# Patient Record
Sex: Female | Born: 1940 | Race: White | Hispanic: No
Health system: Southern US, Community
[De-identification: ages and names within clinical notes are randomized; demographics above are authoritative.]

---

## 2016-04-16 ENCOUNTER — Other Ambulatory Visit: Payer: Self-pay | Admitting: Nurse Practitioner

## 2016-04-16 DIAGNOSIS — M533 Sacrococcygeal disorders, not elsewhere classified: Secondary | ICD-10-CM

## 2016-04-16 DIAGNOSIS — M47816 Spondylosis without myelopathy or radiculopathy, lumbar region: Secondary | ICD-10-CM

## 2016-04-25 ENCOUNTER — Ambulatory Visit
Admission: RE | Admit: 2016-04-25 | Discharge: 2016-04-25 | Disposition: A | Discharge status: Home / Self Care | Payer: Medicare Other | Source: Ambulatory Visit | Attending: Nurse Practitioner | Admitting: Nurse Practitioner

## 2016-04-25 DIAGNOSIS — M47816 Spondylosis without myelopathy or radiculopathy, lumbar region: Secondary | ICD-10-CM

## 2016-04-25 DIAGNOSIS — M533 Sacrococcygeal disorders, not elsewhere classified: Secondary | ICD-10-CM

## 2016-04-25 MED ORDER — METHYLPREDNISOLONE ACETATE 40 MG/ML INJ SUSP (RADIOLOG
120.0000 mg | Freq: Once | INTRAMUSCULAR | Status: AC
  Administered 2016-04-25: 120 mg via EPIDURAL

## 2016-04-25 MED ORDER — IOPAMIDOL (ISOVUE-M 200) INJECTION 41%
1.0000 mL | Freq: Once | INTRAMUSCULAR | Status: AC
  Administered 2016-04-25: 1 mL via EPIDURAL

## 2016-04-25 NOTE — Discharge Instructions (Signed)

## 2016-05-17 ENCOUNTER — Other Ambulatory Visit: Payer: Self-pay | Admitting: Nurse Practitioner

## 2016-05-17 DIAGNOSIS — M461 Sacroiliitis, not elsewhere classified: Secondary | ICD-10-CM

## 2016-05-29 ENCOUNTER — Ambulatory Visit
Admission: RE | Admit: 2016-05-29 | Discharge: 2016-05-29 | Disposition: A | Discharge status: Home / Self Care | Payer: Medicare Other | Source: Ambulatory Visit | Attending: Nurse Practitioner | Admitting: Nurse Practitioner

## 2016-05-29 DIAGNOSIS — M461 Sacroiliitis, not elsewhere classified: Secondary | ICD-10-CM

## 2016-05-29 MED ORDER — IOPAMIDOL (ISOVUE-M 200) INJECTION 41%
1.0000 mL | Freq: Once | INTRAMUSCULAR | Status: AC
  Administered 2016-05-29: 1 mL via INTRA_ARTICULAR

## 2016-05-29 MED ORDER — METHYLPREDNISOLONE ACETATE 40 MG/ML INJ SUSP (RADIOLOG
120.0000 mg | Freq: Once | INTRAMUSCULAR | Status: AC
  Administered 2016-05-29: 120 mg via INTRA_ARTICULAR

## 2016-05-29 NOTE — Discharge Instructions (Signed)

## 2016-06-25 ENCOUNTER — Other Ambulatory Visit: Payer: Self-pay | Admitting: Neurosurgery

## 2016-06-25 DIAGNOSIS — M47816 Spondylosis without myelopathy or radiculopathy, lumbar region: Secondary | ICD-10-CM

## 2016-06-27 ENCOUNTER — Ambulatory Visit
Admission: RE | Admit: 2016-06-27 | Discharge: 2016-06-27 | Disposition: A | Discharge status: Home / Self Care | Payer: Medicare Other | Source: Ambulatory Visit | Attending: Neurosurgery | Admitting: Neurosurgery

## 2016-06-27 DIAGNOSIS — M47816 Spondylosis without myelopathy or radiculopathy, lumbar region: Secondary | ICD-10-CM

## 2016-06-27 MED ORDER — IOPAMIDOL (ISOVUE-M 200) INJECTION 41%
1.0000 mL | Freq: Once | INTRAMUSCULAR | Status: AC
  Administered 2016-06-27: 1 mL via INTRA_ARTICULAR

## 2016-06-27 MED ORDER — METHYLPREDNISOLONE ACETATE 40 MG/ML INJ SUSP (RADIOLOG
120.0000 mg | Freq: Once | INTRAMUSCULAR | Status: AC
  Administered 2016-06-27: 120 mg via INTRA_ARTICULAR

## 2016-06-27 NOTE — Discharge Instructions (Signed)

## 2018-05-21 IMAGING — XA DG FLUORO GUIDE SPINAL/SI JT INJ*L*
1 series · 1 of 1 positions shown · non-contrast
Comparison: none

CLINICAL DATA: Suspected LEFT sacroiliac joint dysfunction.

[Series 1: ortho standard · 1 of 1 slices shown]
[im 1/1]
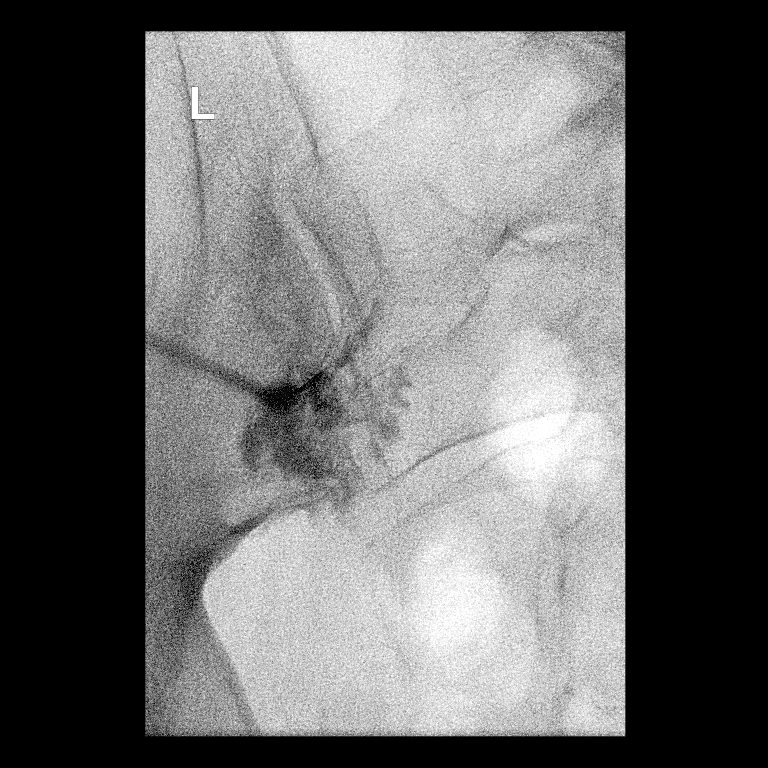

[1 of 1 positions shown; findings below may reference images not displayed]

FLUOROSCOPY TIME:  18 seconds corresponding to a Dose Area Product
of 12.36 ?Gy*m2

PROCEDURE:
LEFT SI JOINT INJECTION.

Informed written consent was obtained.  Time-out was performed.

The patient was placed prone on the fluoroscopy table and
localization was performed over the sacrum. Target sitemarked using
fluoroscopic guidance. The skin was prepped and draped in the usual
sterile fashion using Betadine soap.

After local anesthesia with 1% lidocaine without epinephrine and
subsequent deep anesthesia, a 20 gauge spinal needle was advanced
into the LEFT SI joint. Injection of 1 ml Isovue-M 200 confirmed
intra-articular placement. No vascular uptake present. Subsequently,
120.0 of Depo-Medrol along with 2 mL of 0.25% Sensorcaine was
injected into SI joint. Needles removed and a sterile dressing
applied.

No complications were observed. The patient was observed and
released under the care of a driver after 30 minutes. The patient
was markedly improved postprocedure.
IMPRESSION: Successful fluoroscopically guided LEFT SI joint injection. Marked
improvement post procedure.
